# Patient Record
Sex: Male | Born: 1935 | Race: White | Hispanic: No | Marital: Married | Smoking: Former smoker
Health system: Southern US, Community
[De-identification: ages and names within clinical notes are randomized; demographics above are authoritative.]

## PROBLEM LIST (undated history)

## (undated) DIAGNOSIS — I10 Essential (primary) hypertension: Secondary | ICD-10-CM

---

## 2008-03-09 ENCOUNTER — Ambulatory Visit: Payer: Self-pay | Admitting: Family Medicine

## 2008-03-09 DIAGNOSIS — H6980 Other specified disorders of Eustachian tube, unspecified ear: Secondary | ICD-10-CM | POA: Insufficient documentation

## 2008-03-09 DIAGNOSIS — H6690 Otitis media, unspecified, unspecified ear: Secondary | ICD-10-CM | POA: Insufficient documentation

## 2019-11-07 ENCOUNTER — Emergency Department
Admission: RE | Admit: 2019-11-07 | Discharge: 2019-11-07 | Disposition: A | Discharge status: Home / Self Care | Payer: Medicare Other | Source: Ambulatory Visit | Attending: Family Medicine | Admitting: Family Medicine

## 2019-11-07 ENCOUNTER — Other Ambulatory Visit: Payer: Self-pay

## 2019-11-07 ENCOUNTER — Emergency Department (INDEPENDENT_AMBULATORY_CARE_PROVIDER_SITE_OTHER): Payer: Medicare Other

## 2019-11-07 VITALS — BP 127/71 | HR 89 | Temp 98.6°F | Ht 69.0 in | Wt 200.0 lb

## 2019-11-07 DIAGNOSIS — M1712 Unilateral primary osteoarthritis, left knee: Secondary | ICD-10-CM

## 2019-11-07 DIAGNOSIS — M25562 Pain in left knee: Secondary | ICD-10-CM | POA: Diagnosis not present

## 2019-11-07 DIAGNOSIS — M545 Low back pain, unspecified: Secondary | ICD-10-CM

## 2019-11-07 DIAGNOSIS — G8929 Other chronic pain: Secondary | ICD-10-CM | POA: Diagnosis not present

## 2019-11-07 DIAGNOSIS — M25462 Effusion, left knee: Secondary | ICD-10-CM

## 2019-11-07 DIAGNOSIS — M5137 Other intervertebral disc degeneration, lumbosacral region: Secondary | ICD-10-CM | POA: Diagnosis not present

## 2019-11-07 HISTORY — DX: Essential (primary) hypertension: I10

## 2019-11-07 NOTE — ED Provider Notes (Signed)
[facility]    CSN: [id number] Arrival date & time: 11/07/19  1153      History   Chief Complaint Chief Complaint  Patient presents with  . Knee Pain    HPI Bradley Padilla is a 86 y.o. male.   Patient reports that he has had a long history of chronic left knee pain that he simply "deals with."  Two days ago he fell and "pulled something" in his left knee, followed by persistent pain with weight bearing and knee movement.  He has been wearing a tightly applied elastic sleeve and using canes.  His pain has not improved. He also complains of approximately 6 to 8 month history of vague bilateral pain in his buttocks which he attributes to "sciatica."  He notes that the pain is in his lateral thighs but does not radiate down his legs, and is worse when walking.  He denies bowel or bladder dysfunction, and no saddle numbness.   The history is provided by the patient and a relative.  Knee Pain Location:  Knee Time since incident:  2 days Injury: yes   Mechanism of injury: fall   Knee location:  L knee Pain details:    Quality:  Aching   Radiates to:  Does not radiate   Severity:  Moderate   Onset quality:  Sudden   Duration:  2 days   Timing:  Constant   Progression:  Unchanged Relieved by:  Nothing Worsened by:  Activity, bearing weight, extension and flexion Ineffective treatments:  Immobilization Associated symptoms: back pain, decreased ROM, stiffness and swelling   Associated symptoms: no fatigue, no fever, no muscle weakness, no numbness and no tingling     Past Medical History:  Diagnosis Date  . Hypertension     Patient Active Problem List   Diagnosis Date Noted  . EUSTACHIAN TUBE DYSFUNCTION, RIGHT 03/09/2008  . UNSPECIFIED OTITIS MEDIA 03/09/2008    History reviewed. No pertinent surgical history.     Home Medications    Prior to Admission medications   Medication Sig Start Date End Date Taking? Authorizing Provider  apixaban (ELIQUIS)  5 MG TABS tablet Take by mouth. 07/01/19  Yes [provider]  Aspirin Buf,CaCarb-MgCarb-MgO, 81 MG TABS    Yes [provider]  diclofenac Sodium (VOLTAREN) 1 % GEL Apply topically. 05/14/19  Yes [provider]  levothyroxine (EUTHYROX) 25 MCG tablet Take 1 tablet by mouth daily. 10/11/19  Yes [provider]  losartan (COZAAR) 50 MG tablet Take 1 tablet by mouth daily. 11/17/12  Yes [provider]  telmisartan (MICARDIS) 40 MG tablet Take 1 tablet by mouth daily. 10/08/18  Yes [provider]    Family History No family history on file.  Social History Social History   Tobacco Use  . Smoking status: Former Games developer  . Smokeless tobacco: Never Used  Substance Use Topics  . Alcohol use: Not on file  . Drug use: Not on file     Allergies   Bee venom   Review of Systems Review of Systems  Constitutional: Positive for activity change. Negative for chills, diaphoresis, fatigue and fever.  HENT: Negative.   Eyes: Negative.   Respiratory: Negative.   Cardiovascular: Negative.   Gastrointestinal: Negative.   Genitourinary: Negative.   Musculoskeletal: Positive for back pain, joint swelling and stiffness.  Skin: Negative.   Neurological: Negative for dizziness.  All other systems reviewed and are negative.    Physical Exam Triage Vital Signs ED  Triage Vitals  Enc Vitals Group     BP 11/07/19 1353 127/71     Pulse Rate 11/07/19 1353 89     Resp --      Temp 11/07/19 1353 98.6 F (37 C)     Temp Source 11/07/19 1353 Tympanic     SpO2 11/07/19 1353 98 %     Weight 11/07/19 1349 200 lb (90.7 kg)     Height 11/07/19 1349 5\' 9"  (1.753 m)     Head Circumference --      Peak Flow --      Pain Score 11/07/19 1349 7     Pain Loc --      Pain Edu? --      Excl. in GC? --    No data found.  Updated Vital Signs BP 127/71 (BP Location: Left Arm)   Pulse 89   Temp 98.6 F (37 C) (Tympanic)   Ht 5\' 9"  (1.753 m)   Wt 90.7  kg   SpO2 98%   BMI 29.53 kg/m   Visual Acuity Right Eye Distance:   Left Eye Distance:   Bilateral Distance:    Right Eye Near:   Left Eye Near:    Bilateral Near:     Physical Exam Vitals and nursing note reviewed.  Constitutional:      General: He is not in acute distress. HENT:     Head: Normocephalic.  Eyes:     Pupils: Pupils are equal, round, and reactive to light.  Cardiovascular:     Rate and Rhythm: Normal rate.  Pulmonary:     Effort: Pulmonary effort is normal.  Musculoskeletal:     Cervical back: Normal range of motion.     Lumbar back: No swelling, deformity, tenderness or bony tenderness. Negative right straight leg raise test.     Left knee: Bony tenderness present. No swelling, deformity, ecchymosis, lacerations or crepitus. Decreased range of motion. Tenderness present.     Right lower leg: No edema.     Left lower leg: No edema.     Comments: After removing elastic sleeve from left knee, patient is unable to actively extend or flex the knee, and passive motion is minimal.  There is bilateral tenderness to palpation over joint lines.  Unable to perform McMurray test.  Distal neurovascular function is intact.   Unable to adequately evaluate back range of motion because of patient's left knee pain.  Right straight leg-raise and sitting knee extension tests are negative.  Patellar and achilles reflexes are minimal.  Bilateral hip rotation causes minimal discomfort.  Skin:    General: Skin is warm and dry.     Findings: No rash.  Neurological:     Mental Status: He is alert and oriented to person, place, and time.      UC Treatments / Results  Labs (all labs ordered are listed, but only abnormal results are displayed) Labs Reviewed - No data to display  EKG   Radiology DG Lumbar Spine Complete  Result Date: 11/07/2019 CLINICAL DATA:  Pain EXAM: LUMBAR SPINE - COMPLETE 4+ VIEW COMPARISON:  None. FINDINGS: Osteopenia. There are five non-rib bearing  lumbar-type vertebral bodies with sacralization of L5 with bilateral assimilation joints. There is normal alignment. There is no evidence for acute fracture or subluxation. Mild intervertebral disc space height loss at L4-5. mild multilevel endplate proliferative changes. Atherosclerotic calcifications of the aorta. Limited assessment of the sacrum secondary to overlying bowel gas. IMPRESSION: 1. No acute osseous abnormality  in the lumbar spine. 2. Mild degenerative disc disease at L4-5. Electronically Signed   By: Meda Klinefelter MD   On: 11/07/2019 16:09   DG Knee Complete 4 Views Left  Result Date: 11/07/2019 CLINICAL DATA:  Chronic knee pain EXAM: LEFT KNEE - COMPLETE 4+ VIEW COMPARISON:  None. FINDINGS: Osteopenia. There is an irregular contour of the lateral tibial plateau which may reflect a insufficiency fracture of the lateral tibial plateau. Osteophyte proliferative changes of the lateral compartment. Moderate degenerative changes of the medial and mild degenerative changes of the patellofemoral compartments. Enthesophyte of the quadriceps tendon insertion. Small joint effusion. Vascular calcifications. IMPRESSION: 1. Irregular contour of the lateral tibial plateau which may reflect a insufficiency fracture of the lateral tibial plateau. Correlate with point tenderness. This could be further assessed with dedicated MRI. 2. 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 3. Small joint effusion. Electronically Signed   By: Meda Klinefelter MD   On: 11/07/2019 16:05    Procedures Procedures (including critical care time)  Medications Ordered in UC Medications - No data to display  Initial Impression / Assessment and Plan / UC Course  I have reviewed the triage vital signs and the nursing notes.  Pertinent labs & imaging results that were available during my care of the patient were reviewed by me and considered in my medical decision making (see chart for  details).    Dispensed hinged knee brace.  Doubt fracture tibial plateau, but MRI would be next step. Suspect that bilateral lower back pain may represent hip osteoarthritis. Followup with Dr. [facility]ney [facility] for further evaluation/management.   Final Clinical Impressions(s) / UC Diagnoses   Final diagnoses:  Chronic pain of left knee  Chronic bilateral low back pain, unspecified whether sciatica present     Discharge Instructions     Wear hinged knee brace.  Continue using canes for stability.  May take Ibuprofen 200mg , 4 tabs every 8 hours with food.    ED Prescriptions    None        , MD 11/08/19 1053

## 2019-11-07 NOTE — ED Triage Notes (Signed)
Pt states that he has a history of knee problems. Pt states that he fell on  Friday and pulled something in his knee. Pt states that he is vaccinated.

## 2019-11-07 NOTE — Discharge Instructions (Addendum)
Wear hinged knee brace.  Continue using canes for stability.  May take Ibuprofen 200mg , 4 tabs every 8 hours with food.

## 2019-11-29 DEATH — deceased

## 2022-03-19 IMAGING — DX DG KNEE COMPLETE 4+V*L*
4 series · 4 of 4 positions shown · non-contrast
Comparison: None.

CLINICAL DATA: Chronic knee pain

EXAM:
LEFT KNEE - COMPLETE 4+ VIEW

[knee ap]
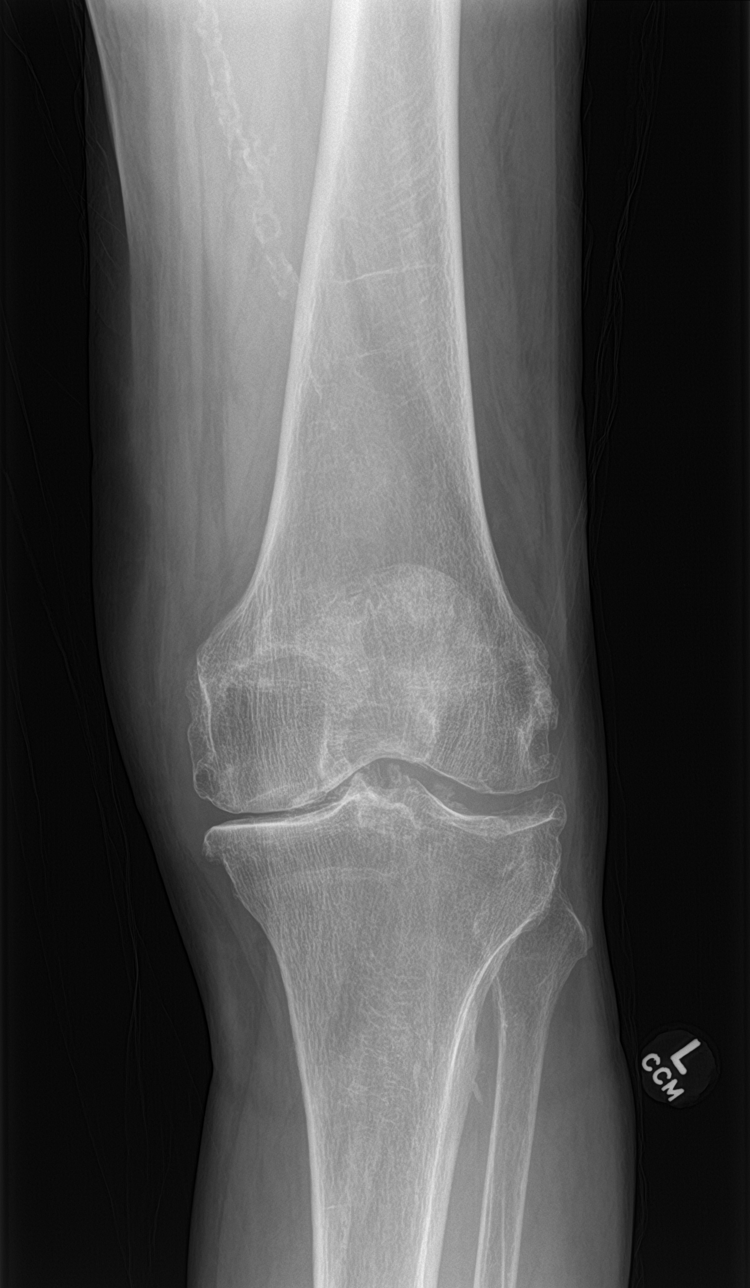

[knee lat]
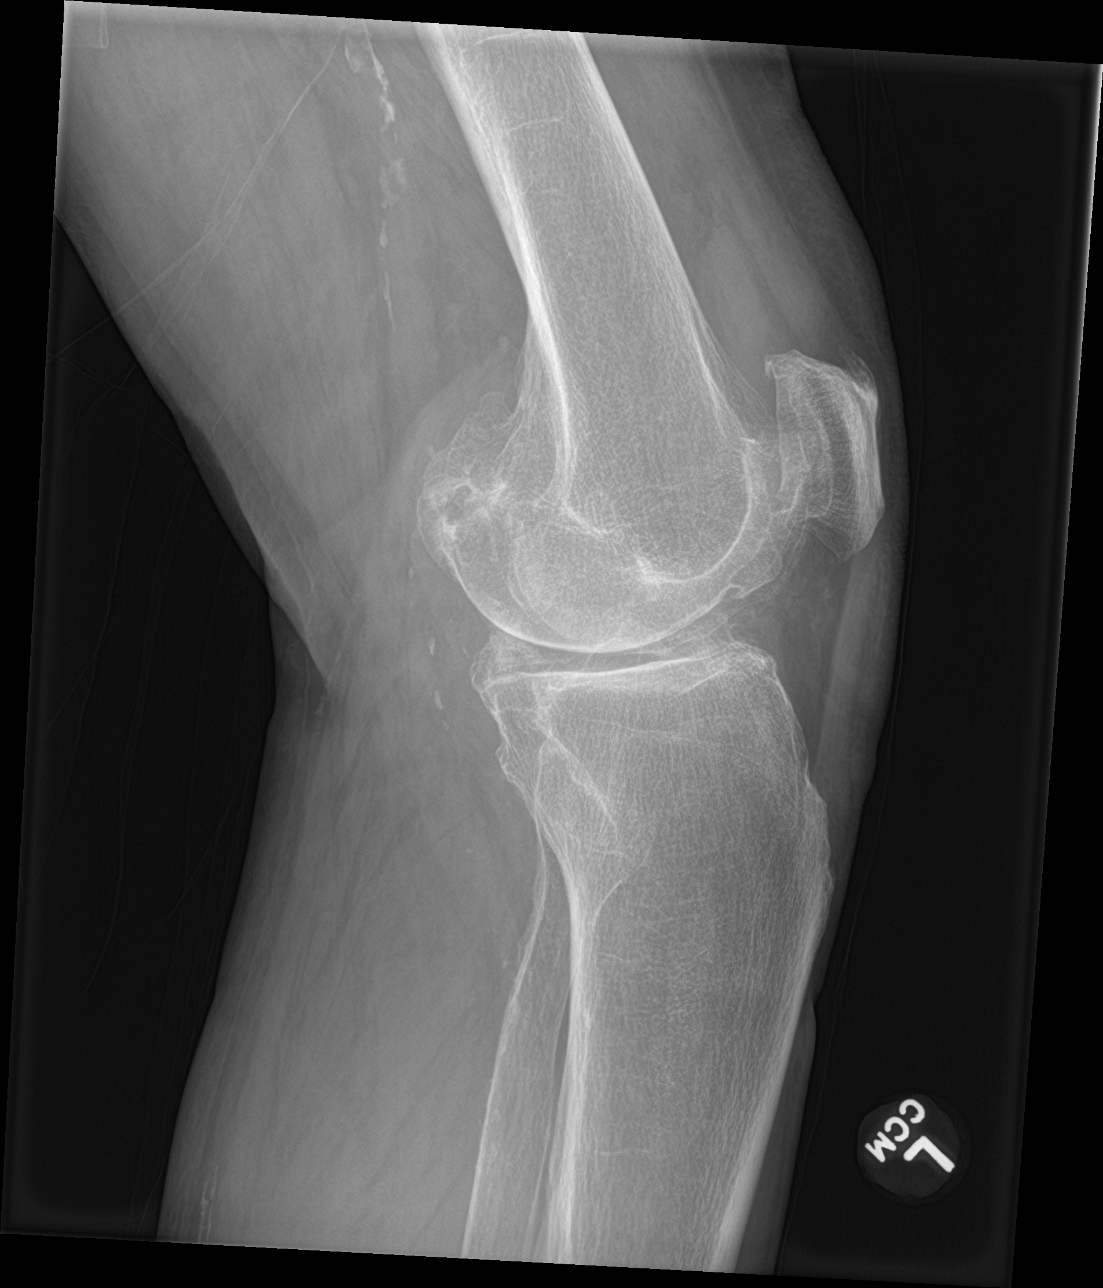

[knee obl (1 of 2)]
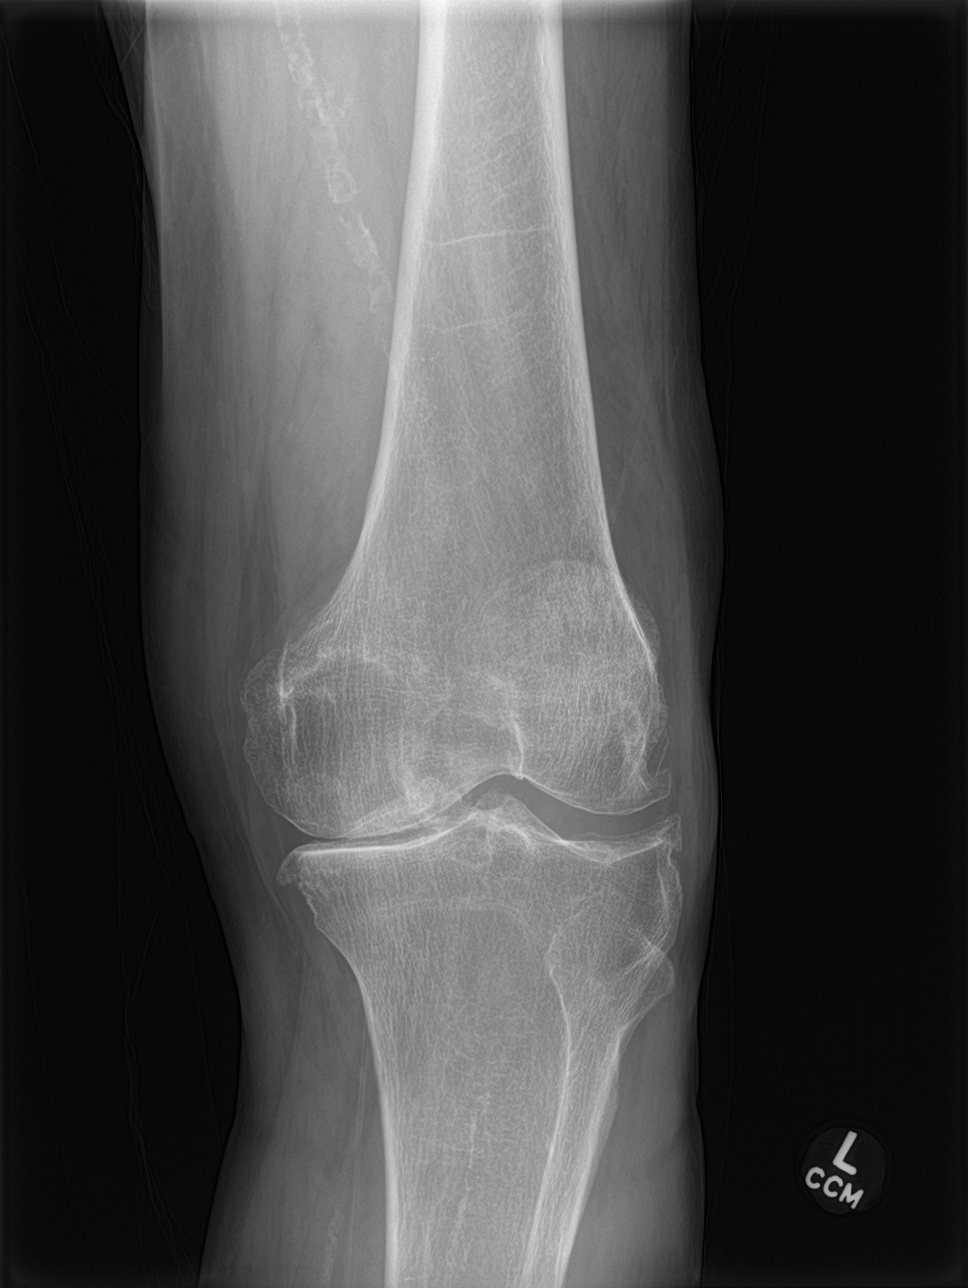

[knee obl (2 of 2)]
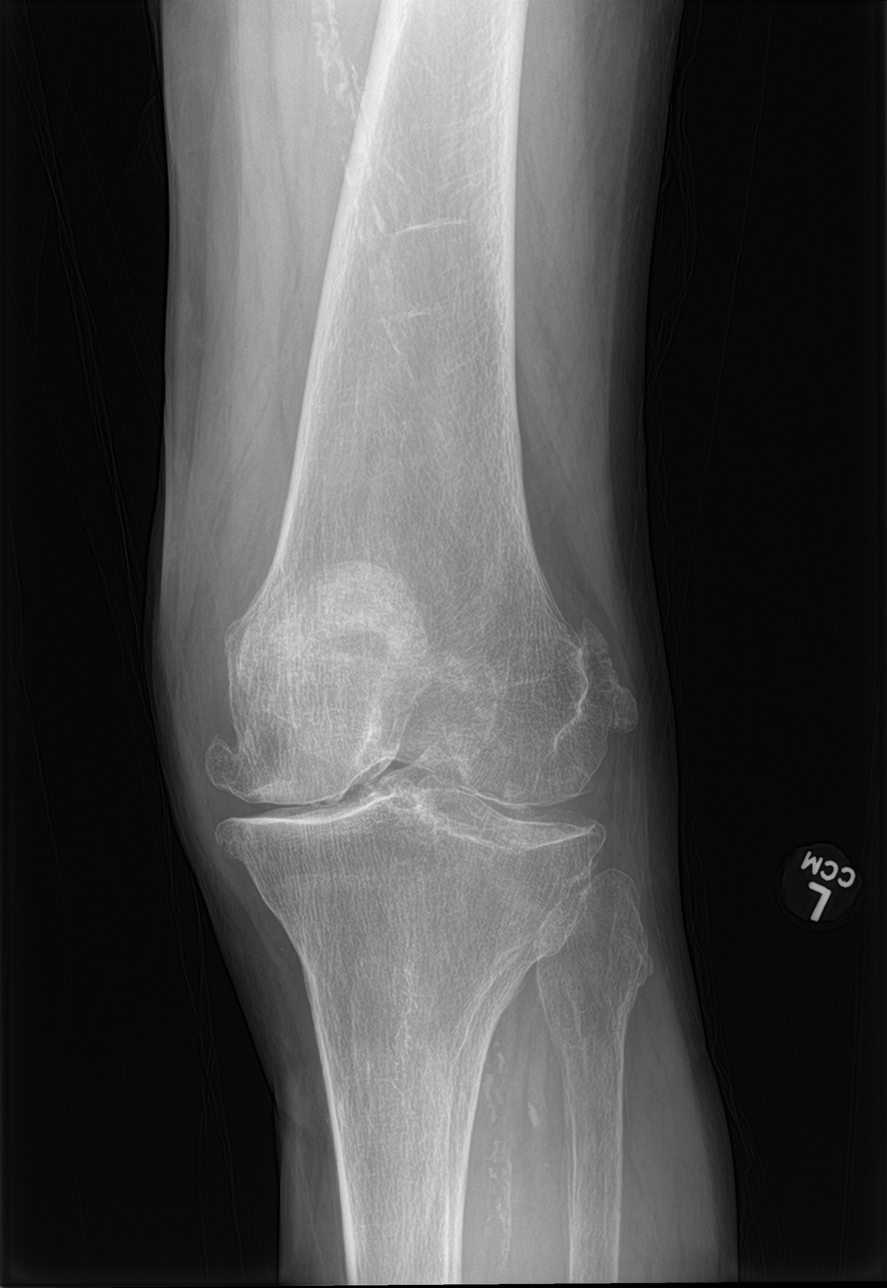

[4 of 4 positions shown; findings below may reference images not displayed]

FINDINGS: Osteopenia. There is an irregular contour of the lateral tibial
plateau which may reflect a insufficiency fracture of the lateral
tibial plateau. Osteophyte proliferative changes of the lateral
compartment. Moderate degenerative changes of the medial and mild
degenerative changes of the patellofemoral compartments.
Enthesophyte of the quadriceps tendon insertion. Small joint
effusion. Vascular calcifications.
IMPRESSION: 1. Irregular contour of the lateral tibial plateau which may reflect
a insufficiency fracture of the lateral tibial plateau. Correlate
with point tenderness. This could be further assessed with dedicated
MRI.
2. Moderate medial and mild patellofemoral compartment
osteoarthritis.
3. Small joint effusion.
